# Patient Record
Sex: Female | Born: 1955 | Hispanic: No | Marital: Married | Smoking: Former smoker
Health system: Southern US, Community
[De-identification: ages and names within clinical notes are randomized; demographics above are authoritative.]

## PROBLEM LIST (undated history)

## (undated) DIAGNOSIS — I10 Essential (primary) hypertension: Secondary | ICD-10-CM

## (undated) HISTORY — PX: BREAST LUMPECTOMY: SHX2

## (undated) HISTORY — PX: TOTAL ABDOMINAL HYSTERECTOMY: SHX209

## (undated) HISTORY — DX: Essential (primary) hypertension: I10

---

## 2002-09-29 ENCOUNTER — Encounter: Payer: Self-pay | Admitting: Internal Medicine

## 2002-09-29 ENCOUNTER — Encounter: Admission: RE | Admit: 2002-09-29 | Discharge: 2002-09-29 | Payer: Self-pay | Admitting: Internal Medicine

## 2014-03-16 ENCOUNTER — Emergency Department (HOSPITAL_BASED_OUTPATIENT_CLINIC_OR_DEPARTMENT_OTHER): Payer: Self-pay

## 2014-03-16 ENCOUNTER — Emergency Department (HOSPITAL_BASED_OUTPATIENT_CLINIC_OR_DEPARTMENT_OTHER)
Admission: EM | Admit: 2014-03-16 | Discharge: 2014-03-16 | Disposition: A | Discharge status: Home / Self Care | Payer: Self-pay | Attending: Emergency Medicine | Admitting: Emergency Medicine

## 2014-03-16 ENCOUNTER — Encounter (HOSPITAL_BASED_OUTPATIENT_CLINIC_OR_DEPARTMENT_OTHER): Payer: Self-pay | Admitting: Radiology

## 2014-03-16 DIAGNOSIS — J159 Unspecified bacterial pneumonia: Secondary | ICD-10-CM | POA: Insufficient documentation

## 2014-03-16 DIAGNOSIS — R918 Other nonspecific abnormal finding of lung field: Secondary | ICD-10-CM

## 2014-03-16 DIAGNOSIS — Z79899 Other long term (current) drug therapy: Secondary | ICD-10-CM | POA: Insufficient documentation

## 2014-03-16 DIAGNOSIS — J189 Pneumonia, unspecified organism: Secondary | ICD-10-CM

## 2014-03-16 DIAGNOSIS — R911 Solitary pulmonary nodule: Secondary | ICD-10-CM | POA: Insufficient documentation

## 2014-03-16 LAB — CBC WITH DIFFERENTIAL/PLATELET
BASOS ABS: 0 10*3/uL (ref 0.0–0.1)
Basophils Relative: 0 % (ref 0–1)
Eosinophils Absolute: 0.2 10*3/uL (ref 0.0–0.7)
Eosinophils Relative: 2 % (ref 0–5)
HCT: 39.3 % (ref 36.0–46.0)
Hemoglobin: 13.3 g/dL (ref 12.0–15.0)
LYMPHS PCT: 24 % (ref 12–46)
Lymphs Abs: 2.5 10*3/uL (ref 0.7–4.0)
MCH: 26.5 pg (ref 26.0–34.0)
MCHC: 33.8 g/dL (ref 30.0–36.0)
MCV: 78.3 fL (ref 78.0–100.0)
Monocytes Absolute: 1 10*3/uL (ref 0.1–1.0)
Monocytes Relative: 9 % (ref 3–12)
NEUTROS ABS: 6.8 10*3/uL (ref 1.7–7.7)
NEUTROS PCT: 65 % (ref 43–77)
PLATELETS: 213 10*3/uL (ref 150–400)
RBC: 5.02 MIL/uL (ref 3.87–5.11)
RDW: 14.9 % (ref 11.5–15.5)
WBC: 10.4 10*3/uL (ref 4.0–10.5)

## 2014-03-16 LAB — COMPREHENSIVE METABOLIC PANEL
ALT: 15 U/L (ref 0–35)
AST: 16 U/L (ref 0–37)
Albumin: 3.8 g/dL (ref 3.5–5.2)
Alkaline Phosphatase: 87 U/L (ref 39–117)
Anion gap: 16 — ABNORMAL HIGH (ref 5–15)
BILIRUBIN TOTAL: 0.2 mg/dL — AB (ref 0.3–1.2)
BUN: 11 mg/dL (ref 6–23)
CHLORIDE: 100 meq/L (ref 96–112)
CO2: 25 mEq/L (ref 19–32)
Calcium: 9.8 mg/dL (ref 8.4–10.5)
Creatinine, Ser: 0.9 mg/dL (ref 0.50–1.10)
GFR calc Af Amer: 81 mL/min — ABNORMAL LOW (ref >90)
GFR, EST NON AFRICAN AMERICAN: 70 mL/min — AB (ref >90)
Glucose, Bld: 104 mg/dL — ABNORMAL HIGH (ref 70–99)
Potassium: 3.7 mEq/L (ref 3.7–5.3)
SODIUM: 141 meq/L (ref 137–147)
Total Protein: 7.8 g/dL (ref 6.0–8.3)

## 2014-03-16 MED ORDER — IOHEXOL 300 MG/ML  SOLN
80.0000 mL | Freq: Once | INTRAMUSCULAR | Status: AC | PRN
  Administered 2014-03-16: 80 mL via INTRAVENOUS

## 2014-03-16 MED ORDER — PREDNISONE 10 MG PO TABS: 20.0000 mg | ORAL_TABLET | Freq: Every day | ORAL | Status: DC

## 2014-03-16 MED ORDER — LEVOFLOXACIN 500 MG PO TABS: 500.0000 mg | ORAL_TABLET | Freq: Every day | ORAL | Status: DC

## 2014-03-16 MED ORDER — SODIUM CHLORIDE 0.9 % IV SOLN
INTRAVENOUS | Status: DC
Start: 2014-03-16 — End: 2014-03-16
  Administered 2014-03-16: 10:00:00 via INTRAVENOUS

## 2014-03-16 MED ORDER — ALBUTEROL SULFATE HFA 108 (90 BASE) MCG/ACT IN AERS
2.0000 | INHALATION_SPRAY | RESPIRATORY_TRACT | Status: DC | PRN
Start: 2014-03-16 — End: 2014-03-16
  Administered 2014-03-16: 2 via RESPIRATORY_TRACT
  Filled 2014-03-16: qty 6.7

## 2014-03-16 MED ORDER — AEROCHAMBER PLUS FLO-VU MEDIUM MISC
1.0000 | Freq: Once | Status: DC
  Filled 2014-03-16: qty 1

## 2014-03-16 MED ORDER — ALBUTEROL SULFATE (2.5 MG/3ML) 0.083% IN NEBU
5.0000 mg | INHALATION_SOLUTION | Freq: Once | RESPIRATORY_TRACT | Status: AC
  Administered 2014-03-16: 5 mg via RESPIRATORY_TRACT
  Filled 2014-03-16: qty 6

## 2014-03-16 MED ORDER — PREDNISONE 50 MG PO TABS
60.0000 mg | ORAL_TABLET | Freq: Once | ORAL | Status: AC
  Administered 2014-03-16: 60 mg via ORAL
  Filled 2014-03-16 (×2): qty 1

## 2014-03-16 MED ORDER — LEVOFLOXACIN 500 MG PO TABS
500.0000 mg | ORAL_TABLET | Freq: Once | ORAL | Status: AC
  Administered 2014-03-16: 500 mg via ORAL
  Filled 2014-03-16: qty 1

## 2014-03-16 NOTE — ED Notes (Signed)
patient states she is having a sore throat, productive cough with chest discomfort when she coughs

## 2014-03-16 NOTE — ED Provider Notes (Signed)
CSN: [id number]     Arrival date & time 03/16/14  [id number] History   First MD Initiated Contact with Patient 03/16/14 [phone]     Chief Complaint  Patient presents with  . Cough     (Consider location/radiation/quality/duration/timing/severity/associated sxs/prior Treatment) HPI 58 y.o.  female comes in today complaining that she has cough productive of tannish colored sputum. Symptoms began several days ago with sore throat and nasal congestion. She has not had a fever or chills. She has some chest discomfort when she coughs. She complains of dyspnea with coughing but states she's not short of breath otherwise. Patient is a smoker and has continued to smoke through her illness.  She has not had flu shot, "i'm scared of them."    History reviewed. No pertinent past medical history. No past surgical history on file. No family history on file. History  Substance Use Topics  . Smoking status: Not on file  . Smokeless tobacco: Not on file  . Alcohol Use: Not on file   OB History   Grav Para Term Preterm Abortions TAB SAB Ect Mult Living                 Review of Systems  All other systems reviewed and are negative.     Allergies  Review of patient's allergies indicates no known allergies.  Home Medications   Prior to Admission medications   Medication Sig Start Date End Date Taking? Authorizing Provider  losartan-hydrochlorothiazide (HYZAAR) 100-12.5 MG per tablet Take 1 tablet by mouth daily.   Yes Historical Provider, MD   BP 159/80  Pulse 88  Temp(Src) 98 F (36.7 C) (Oral)  Resp 22  Ht 4' 11.5" (1.511 m)  Wt 227 lb (102.967 kg)  BMI 45.10 kg/m2  SpO2 99% Physical Exam  Nursing note and vitals reviewed. Constitutional: She is oriented to person, place, and time. She appears well-developed and well-nourished.  HENT:  Head: Normocephalic and atraumatic.  Eyes: Conjunctivae are normal. Pupils are equal, round, and reactive to light.  Neck: Normal range of motion.   Cardiovascular: Normal rate, regular rhythm and normal heart sounds.   Pulmonary/Chest: Effort normal and breath sounds normal. No respiratory distress. She has no wheezes. She has no rales. She exhibits no tenderness.  Abdominal: Soft. Bowel sounds are normal.  Musculoskeletal: Normal range of motion.  Neurological: She is alert and oriented to person, place, and time. She has normal reflexes.  Skin: Skin is warm and dry.  Psychiatric: She has a normal mood and affect. Her behavior is normal. Judgment and thought content normal.    ED Course  Procedures (including critical care time) Labs Review Labs Reviewed  COMPREHENSIVE METABOLIC PANEL - Abnormal; Notable for the following:    Glucose, Bld 104 (*)    Total Bilirubin 0.2 (*)    GFR calc non Af Amer 70 (*)    GFR calc Af Amer 81 (*)    Anion gap 16 (*)    All other components within normal limits  CBC WITH DIFFERENTIAL    Imaging Review Dg Chest 2 View  03/16/2014   CLINICAL DATA:  Cough and congestion for 4 days.  Smoker.  EXAM: CHEST  2 VIEW  COMPARISON:  None.  FINDINGS: There is an irregular masslike opacity best seen on the frontal view in the right mid lung zone measuring 3.7 x 2.8 cm. Right infrahilar fullness is identified. The left lung is clear. Heart size is normal. No pneumothorax or pleural effusion. No  focal bony abnormality.  IMPRESSION: Masslike opacity seen on the PA view in the right mid lung zone worrisome for carcinoma. Chest CT with contrast is recommended further evaluation.   Electronically Signed   By: Drusilla Kannerhomas  Dalessio M.D.   On: 03/16/2014 09:49   Ct Chest W Contrast  03/16/2014   CLINICAL DATA:  58 year old female with acute Cough and masslike opacity in the right mid lung on chest x-Loc Feinstein. Initial encounter. Current history of smoking.  EXAM: CT CHEST WITH CONTRAST  TECHNIQUE: Multidetector CT imaging of the chest was performed during intravenous contrast administration.  CONTRAST:  80mL OMNIPAQUE IOHEXOL  300 MG/ML  SOLN  COMPARISON:  Chest radiographs 0953 hr today.  FINDINGS: The plain radiograph finding corresponds to sub solid ground-glass opacity adjacent to the right hilum encompassing 4 - 4.5 cm in diameter. This has vessels running through it. The adjacent airways are patent.  There are 5 other much smaller areas of sub solid ground-glass opacity in both lungs, ranging from 8 mm to 2 cm. See series 3. These are scattered in the left upper, left lower, and right lower lobes.  Major airways are patent and appear within normal limits. No pleural effusion. No pericardial effusion. No mediastinal or hilar lymphadenopathy.  Negative thoracic inlet. No axillary lymphadenopathy. Major vascular structures of the mediastinum are within normal limits. Negative visualized liver, gallbladder, spleen, pancreas, adrenal glands, renal upper poles, and bowel in the upper abdomen.  No acute or suspicious osseous abnormality.  IMPRESSION: 1. Right midlung chest x-Aeson Sawyers finding corresponds to sub solid ground-glass opacity adjacent to the right hilum measuring 4.5 cm diameter. There are 5 other smaller areas of ground-glass opacity scattered in both lungs ranging from 8 mm to 2 cm. No lymphadenopathy or effusion. 2. Favor inflammation such as acute multifocal pneumonia, and consider viral and atypical etiologies. 3. Initial follow-up by chest CT without contrast is recommended in 3 months to confirm persistence. This recommendation follows the consensus statement: Recommendations for the Management of Subsolid Pulmonary Nodules Detected at CT: A Statement from the Fleischner Society as published in Radiology 2013; 266:304-317.   Electronically Signed   By: Augusto GambleLee  Hall M.D.   On: 03/16/2014 10:58     EKG Interpretation None      MDM   Final diagnoses:  Pulmonary nodules/lesions, multiple  CAP (community acquired pneumonia)    CT with above results/  Patient with multiple nodules likely multifocal  Pneumonia hwoever is  a smoker.  Plan levaquin and op tretment as patient appears clinically well.  She is to follow up with a pulmonary doctor in 1-2 weeks.  Discussed smoking cessation need for follow up and return precautions.    Hilario Quarryanielle S Khyla Mccumbers, MD 03/16/14 [phone]

## 2014-03-16 NOTE — Discharge Instructions (Signed)
Smoking Cessation Quitting smoking is important to your health and has many advantages. However, it is not always easy to quit since nicotine is a very addictive drug. Oftentimes, people try 3 times or more before being able to quit. This document explains the best ways for you to prepare to quit smoking. Quitting takes hard work and a lot of effort, but you can do it. ADVANTAGES OF QUITTING SMOKING  You will live longer, feel better, and live better.  Your body will feel the impact of quitting smoking almost immediately.  Within 20 minutes, blood pressure decreases. Your pulse returns to its normal level.  After 8 hours, carbon monoxide levels in the blood return to normal. Your oxygen level increases.  After 24 hours, the chance of having a heart attack starts to decrease. Your breath, hair, and body stop smelling like smoke.  After 48 hours, damaged nerve endings begin to recover. Your sense of taste and smell improve.  After 72 hours, the body is virtually free of nicotine. Your bronchial tubes relax and breathing becomes easier.  After 2 to 12 weeks, lungs can hold more air. Exercise becomes easier and circulation improves.  The risk of having a heart attack, stroke, cancer, or lung disease is greatly reduced.  After 1 year, the risk of coronary heart disease is cut in half.  After 5 years, the risk of stroke falls to the same as a nonsmoker.  After 10 years, the risk of lung cancer is cut in half and the risk of other cancers decreases significantly.  After 15 years, the risk of coronary heart disease drops, usually to the level of a nonsmoker.  If you are pregnant, quitting smoking will improve your chances of having a healthy baby.  The people you live with, especially any children, will be healthier.  You will have extra money to spend on things other than cigarettes. QUESTIONS TO THINK ABOUT BEFORE ATTEMPTING TO QUIT You may want to talk about your answers with your  health care provider.  Why do you want to quit?  If you tried to quit in the past, what helped and what did not?  What will be the most difficult situations for you after you quit? How will you plan to handle them?  Who can help you through the tough times? Your family? Friends? A health care provider?  What pleasures do you get from smoking? What ways can you still get pleasure if you quit? Here are some questions to ask your health care provider:  How can you help me to be successful at quitting?  What medicine do you think would be best for me and how should I take it?  What should I do if I need more help?  What is smoking withdrawal like? How can I get information on withdrawal? GET READY  Set a quit date.  Change your environment by getting rid of all cigarettes, ashtrays, matches, and lighters in your home, car, or work. Do not let people smoke in your home.  Review your past attempts to quit. Think about what worked and what did not. GET SUPPORT AND ENCOURAGEMENT You have a better chance of being successful if you have help. You can get support in many ways.  Tell your family, friends, and coworkers that you are going to quit and need their support. Ask them not to smoke around you.  Get individual, group, or telephone counseling and support. Programs are available at [employer] and health centers. Call  your local health department for information about programs in your area.  Spiritual beliefs and practices may help some smokers quit.  Download a "quit meter" on your computer to keep track of quit statistics, such as how long you have gone without smoking, cigarettes not smoked, and money saved.  Get a self-help book about quitting smoking and staying off tobacco. LEARN NEW SKILLS AND BEHAVIORS  Distract yourself from urges to smoke. Talk to someone, go for a walk, or occupy your time with a task.  Change your normal routine. Take a different route to work.  Drink tea instead of coffee. Eat breakfast in a different place.  Reduce your stress. Take a hot bath, exercise, or read a book.  Plan something enjoyable to do every day. Reward yourself for not smoking.  Explore interactive web-based programs that specialize in helping you quit. GET MEDICINE AND USE IT CORRECTLY Medicines can help you stop smoking and decrease the urge to smoke. Combining medicine with the above behavioral methods and support can greatly increase your chances of successfully quitting smoking.  Nicotine replacement therapy helps deliver nicotine to your body without the negative effects and risks of smoking. Nicotine replacement therapy includes nicotine gum, lozenges, inhalers, nasal sprays, and skin patches. Some may be available over-the-counter and others require a prescription.  Antidepressant medicine helps people abstain from smoking, but how this works is unknown. This medicine is available by prescription.  Nicotinic receptor partial agonist medicine simulates the effect of nicotine in your brain. This medicine is available by prescription. Ask your health care provider for advice about which medicines to use and how to use them based on your health history. Your health care provider will tell you what side effects to look out for if you choose to be on a medicine or therapy. Carefully read the information on the package. Do not use any other product containing nicotine while using a nicotine replacement product.  RELAPSE OR DIFFICULT SITUATIONS Most relapses occur within the first 3 months after quitting. Do not be discouraged if you start smoking again. Remember, most people try several times before finally quitting. You may have symptoms of withdrawal because your body is used to nicotine. You may crave cigarettes, be irritable, feel very hungry, cough often, get headaches, or have difficulty concentrating. The withdrawal symptoms are only temporary. They are strongest  when you first quit, but they will go away within 10-14 days. To reduce the chances of relapse, try to:  Avoid drinking alcohol. Drinking lowers your chances of successfully quitting.  Reduce the amount of caffeine you consume. Once you quit smoking, the amount of caffeine in your body increases and can give you symptoms, such as a rapid heartbeat, sweating, and anxiety.  Avoid smokers because they can make you want to smoke.  Do not let weight gain distract you. Many smokers will gain weight when they quit, usually less than 10 pounds. Eat a healthy diet and stay active. You can always lose the weight gained after you quit.  Find ways to improve your mood other than smoking. FOR MORE INFORMATION  www.smokefree.gov  Document Released: 05/14/2001 Document Revised: 10/04/2013 Document Reviewed: 08/29/2011 [facility] Patient Information 2015 [place], [place]. This information is not intended to replace advice given to you by your health care provider. Make sure you discuss any questions you have with your health care provider. Pneumonia, Adult Pneumonia is an infection of the lungs. It may be caused by a germ (virus or bacteria). Some types of  pneumonia can spread easily from person to person. This can happen when you cough or sneeze. HOME CARE  Only take medicine as told by your doctor.  Take your medicine (antibiotics) as told. Finish it even if you start to feel better.  Do not smoke.  You may use a vaporizer or humidifier in your room. This can help loosen thick spit (mucus).  Sleep so you are almost sitting up (semi-upright). This helps reduce coughing.  Rest. A shot (vaccine) can help prevent pneumonia. Shots are often advised for:  People over 57 years old.  Patients on chemotherapy.  People with long-term (chronic) lung problems.  People with immune system problems. GET HELP RIGHT AWAY IF:   You are getting worse.  You cannot control your cough, and you are losing  sleep.  You cough up blood.  Your pain gets worse, even with medicine.  You have a fever.  Any of your problems are getting worse, not better.  You have shortness of breath or chest pain. MAKE SURE YOU:   Understand these instructions.  Will watch your condition.  Will get help right away if you are not doing well or get worse. Document Released: 11/06/2007 Document Revised: 08/12/2011 Document Reviewed: 08/10/2010 [facility]Exit[facility] Patient Information 2015 [place], [place]. This information is not intended to replace advice given to you by your health care provider. Make sure you discuss any questions you have with your health care provider. Pulmonary Nodule  A pulmonary nodule is a small, round spot in your lung. It is usually found when pictures of your lungs are taken for other reasons. Most pulmonary nodules are not cancerous and do not cause symptoms. Tests will be done to make sure the nodule is not cancerous. Pulmonary nodules that are not cancerous usually do not require treatment. HOME CARE   Only take medicine as told by your doctor.  Follow up with your doctor as told. GET HELP IF:  You have trouble breathing when doing activities.  You feel sick or more tired than normal.  You do not feel like eating.  You lose weight without trying to.  You have chills.  You have night sweats. GET HELP RIGHT AWAY IF:  You cannot catch your breath.  You start making whistling sounds when breathing (wheezing).  You have a cough that does not go away.  You cough up blood.  You are dizzy or feel like you are going to pass out.  You have sudden chest pain.  You have a fever or lasting symptoms for more than 2-3 days.  You have a fever and your symptoms suddenly get worse. MAKE SURE YOU:  Understand these instructions.  Will watch your condition.  Will get help right away if you are not doing well or get worse. Document Released: 06/22/2010 Document Revised: 01/20/2013  Document Reviewed: 11/09/2012 [facility] Patient Information 2015 [place], [place]. This information is not intended to replace advice given to you by your health care provider. Make sure you discuss any questions you have with your health care provider.

## 2014-03-24 ENCOUNTER — Encounter: Payer: Self-pay | Admitting: Pulmonary Disease

## 2014-03-24 ENCOUNTER — Ambulatory Visit (INDEPENDENT_AMBULATORY_CARE_PROVIDER_SITE_OTHER): Payer: Self-pay | Admitting: Pulmonary Disease

## 2014-03-24 VITALS — BP 157/85 | HR 72 | Temp 97.7°F | Ht 60.0 in | Wt 224.8 lb

## 2014-03-24 DIAGNOSIS — J189 Pneumonia, unspecified organism: Secondary | ICD-10-CM

## 2014-03-24 DIAGNOSIS — R918 Other nonspecific abnormal finding of lung field: Secondary | ICD-10-CM | POA: Insufficient documentation

## 2014-03-24 NOTE — Patient Instructions (Signed)
CXR next Thursday Congratulations on quitting smoking Breathing test next visit Use nicotine patch if you have the urge Flu shot recommended

## 2014-03-24 NOTE — Assessment & Plan Note (Addendum)
Favor infectious or inflammatory cause over malignancy, in this smoker. We'll need to follow this to resolution. She appears clinically much improved. CXR next Thursday-will repeat next chest x-ray after 6 weeks Congratulations on quitting smoking Breathing test next visit Use nicotine patch if you have the urge Flu shot recommended

## 2014-03-24 NOTE — Progress Notes (Signed)
   Subjective:    Patient ID: Mckenzie Hall, female    DOB: [birth year], 57 y.o.   MRN: [id number]  HPI  Chief Complaint  Patient presents with  . Pulmonary Consult    Pt went to ER on 03-16-14 for cough and chest soreness from coughing. Pt states she had a CXR and CT scan and was advised CT was abnormal. She was also treated for pneumonia as well. Pt denies any cough, SOB, or chest soreness at this time.    58 year old smoker presents for evaluation of abnormal imaging studies. She developed a sore throat followed by a chest cold and white sputum. She denies fevers but did have generalized weakness, no sick contacts. She required an emergency room visit. Chest x-ray suggested masslike opacity in the right midlung zone. CT chest showed groundglass opacities in both lungs. The largest area was well rounded measuring up to 4.5 cm close to the right hilum. This opacities showed airways and vessels running through them.  She was treated with prednisone and antibiotics, and states that she is 90% better. Her cough is resolved. She has quit smoking now for one week  Past Medical History  Diagnosis Date  . Hypertension     Past Surgical History  Procedure Laterality Date  . Cesarean section    . Total abdominal hysterectomy    . Breast lumpectomy  1980, 2000    No Known Allergies  History   Social History  . Marital Status: Married    Spouse Name: N/A    Number of Children: N/A  . Years of Education: N/A   Occupational History  . Not on file.   Social History Main Topics  . Smoking status: Former Smoker -- 0.50 packs/day for 35 years    Types: Cigarettes    Quit date: 03/17/2014  . Smokeless tobacco: Not on file  . Alcohol Use: No  . Drug Use: No  . Sexual Activity: Not on file   Other Topics Concern  . Not on file   Social History Narrative  . No narrative on file    Family History  Problem Relation Age of Onset  . Heart disease Mother       Review of Systems   Constitutional: Negative for fever and unexpected weight change.  HENT: Positive for sore throat. Negative for congestion, dental problem, ear pain, nosebleeds, postnasal drip, rhinorrhea, sinus pressure, sneezing and trouble swallowing.   Eyes: Negative for redness and itching.  Respiratory: Negative for cough, chest tightness, shortness of breath and wheezing.   Cardiovascular: Negative for palpitations and leg swelling.  Gastrointestinal: Negative for nausea and vomiting.  Genitourinary: Negative for dysuria.  Musculoskeletal: Negative for joint swelling.  Skin: Negative for rash.  Neurological: Negative for headaches.  Hematological: Does not bruise/bleed easily.  Psychiatric/Behavioral: Negative for dysphoric mood. The patient is not nervous/anxious.        Objective:   Physical Exam  Gen. Pleasant, obese, in no distress, normal affect ENT - no lesions, no post nasal drip, class 2-3 airway Neck: No JVD, no thyromegaly, no carotid bruits Lungs: no use of accessory muscles, no dullness to percussion, decreased without rales or rhonchi  Cardiovascular: Rhythm regular, heart sounds  normal, no murmurs or gallops, no peripheral edema Abdomen: soft and non-tender, no hepatosplenomegaly, BS normal. Musculoskeletal: No deformities, no cyanosis or clubbing Neuro:  alert, non focal, no tremors       Assessment & Plan:

## 2014-03-30 ENCOUNTER — Inpatient Hospital Stay: Payer: Self-pay | Admitting: Internal Medicine

## 2014-03-31 ENCOUNTER — Ambulatory Visit (HOSPITAL_BASED_OUTPATIENT_CLINIC_OR_DEPARTMENT_OTHER)
Admission: RE | Admit: 2014-03-31 | Discharge: 2014-03-31 | Disposition: A | Discharge status: Home / Self Care | Payer: Self-pay | Source: Ambulatory Visit | Attending: Pulmonary Disease | Admitting: Pulmonary Disease

## 2014-03-31 DIAGNOSIS — Z09 Encounter for follow-up examination after completed treatment for conditions other than malignant neoplasm: Secondary | ICD-10-CM | POA: Insufficient documentation

## 2014-03-31 DIAGNOSIS — J189 Pneumonia, unspecified organism: Secondary | ICD-10-CM

## 2014-04-01 ENCOUNTER — Telehealth: Payer: Self-pay | Admitting: Pulmonary Disease

## 2014-04-01 NOTE — Telephone Encounter (Signed)
I spoke with patient about results and she verbalized understanding and had no questions 

## 2014-04-01 NOTE — Telephone Encounter (Signed)
Per CXR results; Result Note     Pneumonia markings have resolved   [id number] x1

## 2014-05-05 ENCOUNTER — Ambulatory Visit (HOSPITAL_BASED_OUTPATIENT_CLINIC_OR_DEPARTMENT_OTHER)
Admission: RE | Admit: 2014-05-05 | Discharge: 2014-05-05 | Disposition: A | Discharge status: Home / Self Care | Payer: Self-pay | Source: Ambulatory Visit | Attending: Pulmonary Disease | Admitting: Pulmonary Disease

## 2014-05-05 ENCOUNTER — Ambulatory Visit (INDEPENDENT_AMBULATORY_CARE_PROVIDER_SITE_OTHER): Payer: Self-pay | Admitting: Pulmonary Disease

## 2014-05-05 ENCOUNTER — Encounter: Payer: Self-pay | Admitting: Pulmonary Disease

## 2014-05-05 VITALS — BP 170/110 | HR 89 | Temp 98.6°F | Ht 60.0 in | Wt 233.0 lb

## 2014-05-05 DIAGNOSIS — J189 Pneumonia, unspecified organism: Secondary | ICD-10-CM | POA: Insufficient documentation

## 2014-05-05 DIAGNOSIS — R918 Other nonspecific abnormal finding of lung field: Secondary | ICD-10-CM

## 2014-05-05 MED ORDER — AMLODIPINE BESYLATE 10 MG PO TABS: 10.0000 mg | ORAL_TABLET | Freq: Every day | ORAL | Status: AC

## 2014-05-05 MED ORDER — LOSARTAN POTASSIUM-HCTZ 100-25 MG PO TABS: 1.0000 | ORAL_TABLET | Freq: Every day | ORAL | Status: AC

## 2014-05-05 NOTE — Progress Notes (Signed)
   Subjective:    Patient ID: Mckenzie Hall, female    DOB: [birth year], 58 y.o.   MRN: [id number]  HPI  58 year old smokerfor FU of abnormal imaging studies. She developed a sore throat followed by a chest cold and white sputum. She denies fevers but did have generalized weakness, no sick contacts. She required an emergency room visit. Chest x-ray suggested masslike opacity in the right midlung zone. CT chest showed groundglass opacities in both lungs. The largest area was well rounded measuring up to 4.5 cm close to the right hilum. The opacities showed airways and vessels running through them.  She was treated with prednisone and antibiotics  She has quit smoking now for one week    Chief Complaint  Patient presents with  . Follow-up    breathing is fine.  no complaints   Back to baseline, breathing ok  Her cough is resolved. Would like refill on her bP med -BP is high today Spirometry - preserved FEV1 92% FU CXR - resolved infx She has quit smoking x 1 month -but smoked 1 cig this  last week  Review of Systems neg for any significant sore throat, dysphagia, itching, sneezing, nasal congestion or excess/ purulent secretions, fever, chills, sweats, unintended wt loss, pleuritic or exertional cp, hempoptysis, orthopnea pnd or change in chronic leg swelling. Also denies presyncope, palpitations, heartburn, abdominal pain, nausea, vomiting, diarrhea or change in bowel or urinary habits, dysuria,hematuria, rash, arthralgias, visual complaints, headache, numbness weakness or ataxia.     Objective:   Physical Exam  Gen. Pleasant, obese, in no distress ENT - no lesions, no post nasal drip Neck: No JVD, no thyromegaly, no carotid bruits Lungs: no use of accessory muscles, no dullness to percussion, decreased without rales or rhonchi  Cardiovascular: Rhythm regular, heart sounds  normal, no murmurs or gallops, no peripheral edema Musculoskeletal: No deformities, no cyanosis or clubbing  , no tremors       Assessment & Plan:

## 2014-05-05 NOTE — Patient Instructions (Addendum)
CXR today Breathing test appears ok Congratulations on quitting smoking CT scan in end -dec

## 2014-05-06 ENCOUNTER — Telehealth: Payer: Self-pay | Admitting: Pulmonary Disease

## 2014-05-06 ENCOUNTER — Encounter: Payer: Self-pay | Admitting: Pulmonary Disease

## 2014-05-06 DIAGNOSIS — R918 Other nonspecific abnormal finding of lung field: Secondary | ICD-10-CM

## 2014-05-06 NOTE — Assessment & Plan Note (Signed)
Favor CAP that has resolved But since infiltrates were not well seen on CXR, will need FU CT to document resolution  Will obtain towards end dec- about 3 months She does nto have airway obstruction & does not needs MDIs

## 2014-05-06 NOTE — Telephone Encounter (Signed)
CT ordered. Nothing further needed  

## 2014-05-30 ENCOUNTER — Ambulatory Visit (HOSPITAL_BASED_OUTPATIENT_CLINIC_OR_DEPARTMENT_OTHER)
Admission: RE | Admit: 2014-05-30 | Discharge: 2014-05-30 | Disposition: A | Discharge status: Home / Self Care | Payer: Self-pay | Source: Ambulatory Visit | Attending: Pulmonary Disease | Admitting: Pulmonary Disease

## 2014-05-30 ENCOUNTER — Telehealth: Payer: Self-pay | Admitting: *Deleted

## 2014-05-30 DIAGNOSIS — I10 Essential (primary) hypertension: Secondary | ICD-10-CM | POA: Insufficient documentation

## 2014-05-30 DIAGNOSIS — R918 Other nonspecific abnormal finding of lung field: Secondary | ICD-10-CM | POA: Insufficient documentation

## 2014-05-30 NOTE — Telephone Encounter (Signed)
error 

## 2015-08-26 IMAGING — CT CT CHEST W/O CM
2 of 3 series · 15 of 36 positions shown, 18 images · non-contrast
Comparison: 05/05/2014

CLINICAL DATA: Hypertension and history of pulmonary infiltrates.
No current complaints.

EXAM:
CT CHEST WITHOUT CONTRAST
TECHNIQUE: Multidetector CT imaging of the chest was performed following the
standard protocol without IV contrast..

[Series 2: chest 5.0 b31f · axial · 0.63mm/px · z∈[-334,-104]mm · 12 of 56 slices shown, 15 images]
[im 5/56  mediastinal]
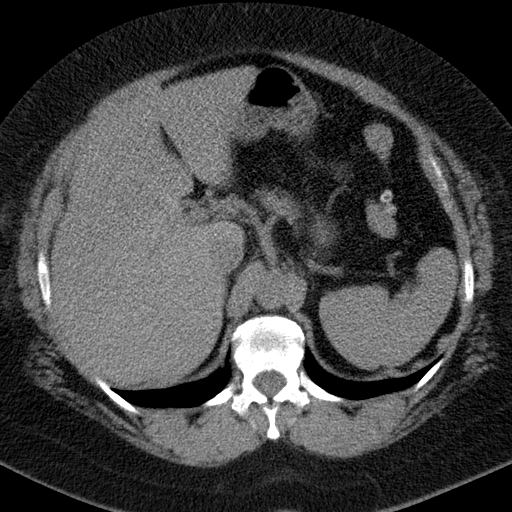
[im 5/56  lung]
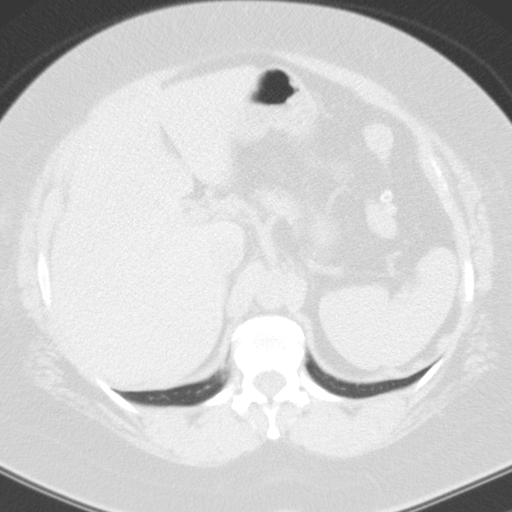
[im 9/56  lung]
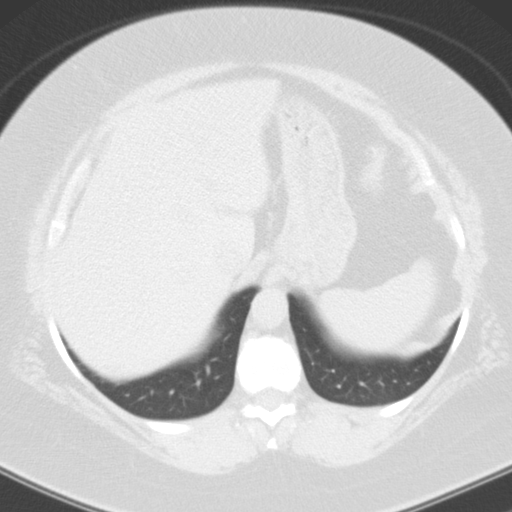
[im 13/56  lung]
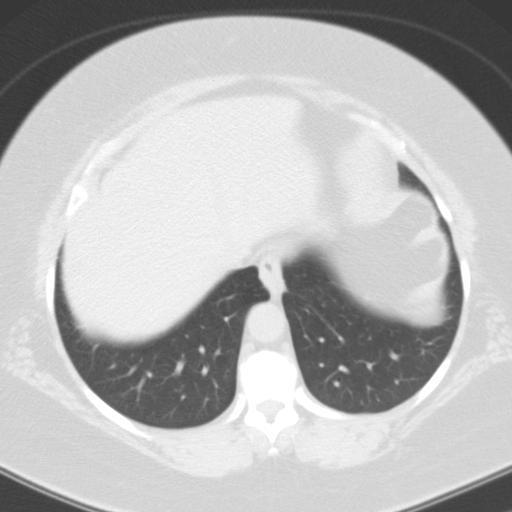
[im 17/56  lung]
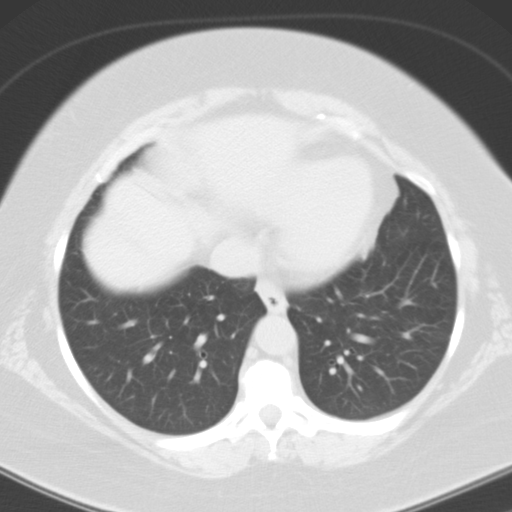
[im 21/56  mediastinal]
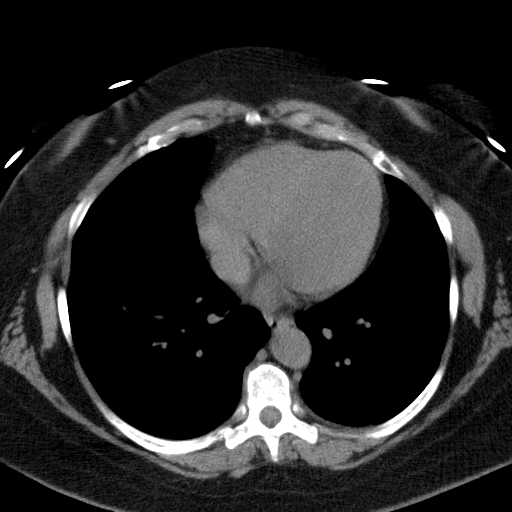
[im 21/56  lung]
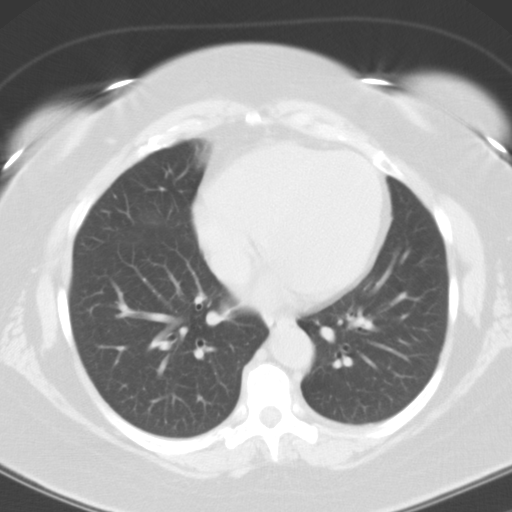
[im 25/56  lung]
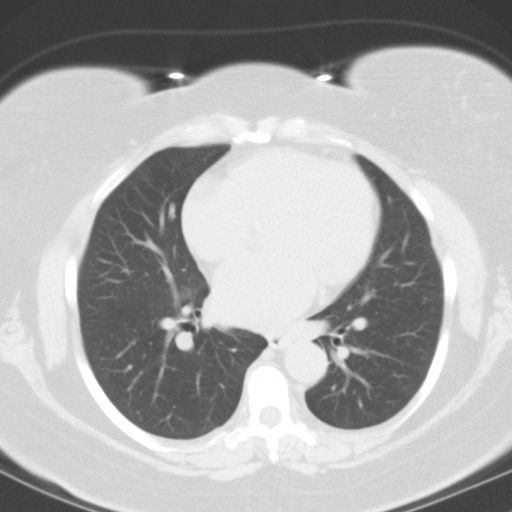
[im 31/56  lung]
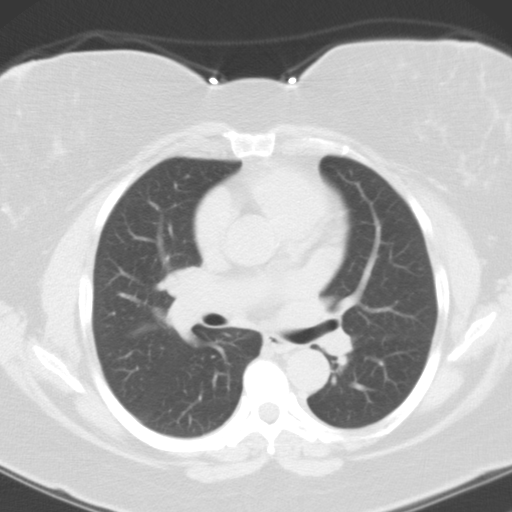
[im 35/56  lung]
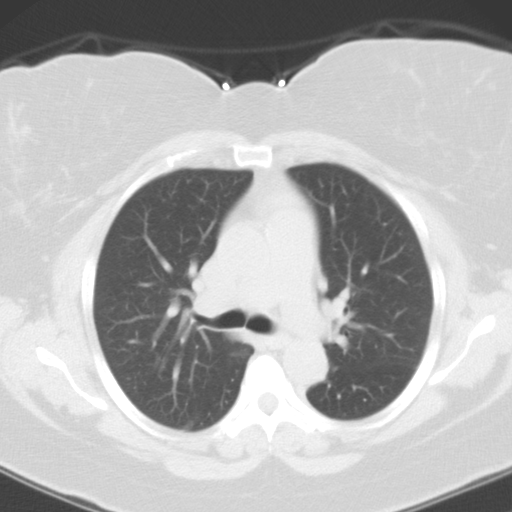
[im 39/56  mediastinal]
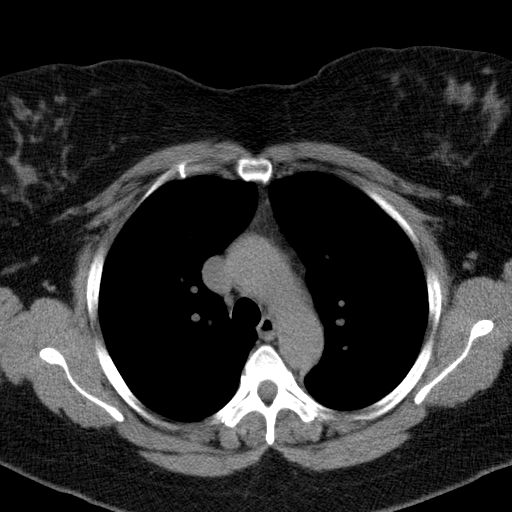
[im 39/56  lung]
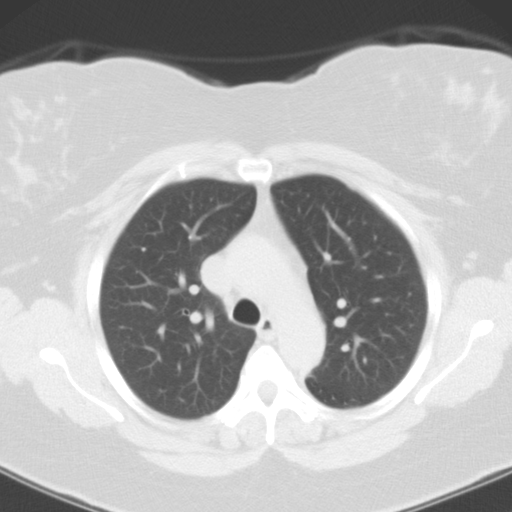
[im 43/56  lung]
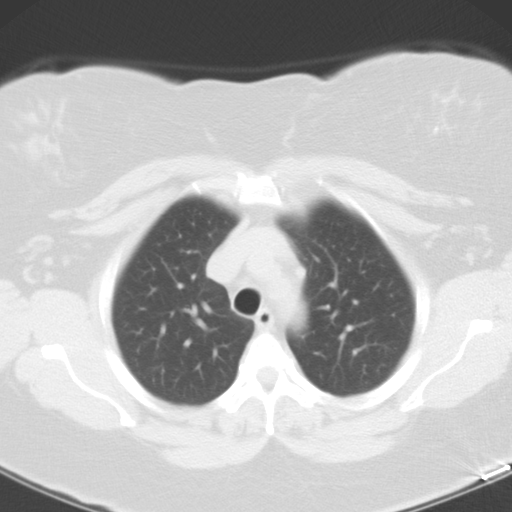
[im 47/56  lung]
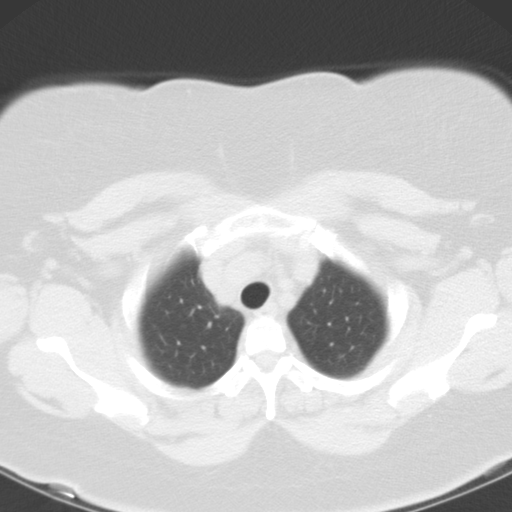
[im 51/56  lung]
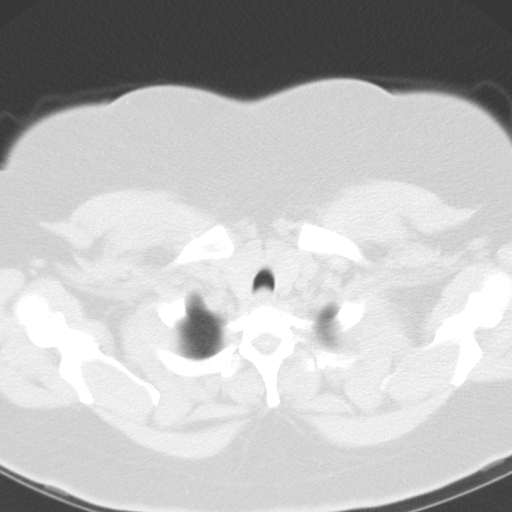

[Series 6: chest 3.0 coronal · coronal · 0.59mm/px · 3 of 87 slices shown]
[im 18/87  lung]
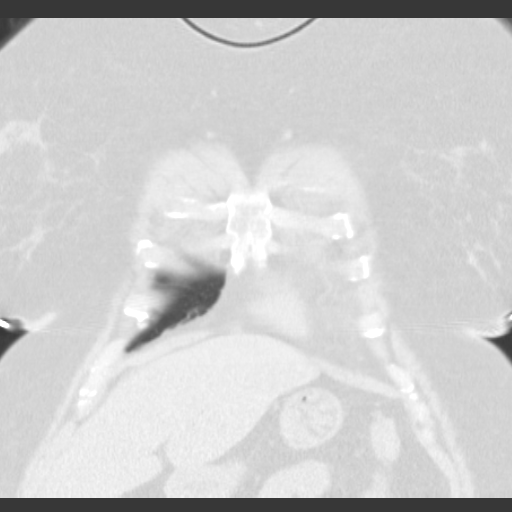
[im 35/87  lung]
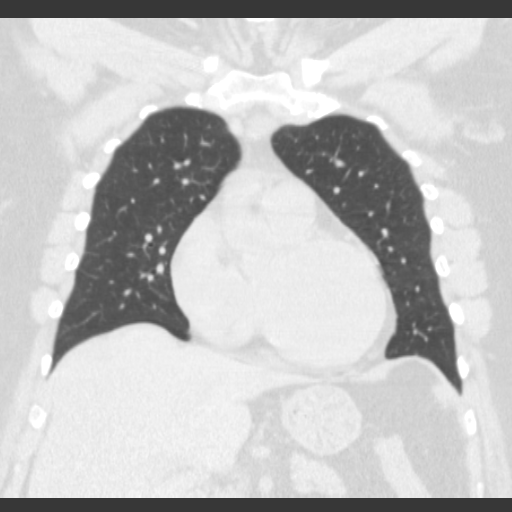
[im 52/87  lung]
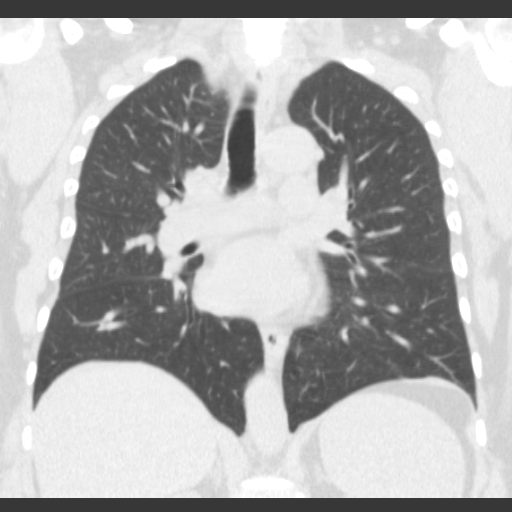

[15 of 36 positions shown; findings below may reference images not displayed]

FINDINGS: Mediastinum: Heart size is normal. No pericardial effusion. There is
no mediastinal or hilar adenopathy identified.

Lungs/Pleura: No pleural effusion identified. There is no airspace
consolidation or atelectasis identified. No suspicious pulmonary
nodule identified.

Upper Abdomen: There is no acute upper abdominal abnormality. The
visualized portions of the spleen are normal.

Musculoskeletal: No suspicious bone abnormality. There is mild multi
level degenerative disc disease within the thoracic spine.
IMPRESSION: 1. No acute findings within the chest.
2. No suspicious pulmonary nodule or mass.
